# Patient Record
Sex: Male | Born: 1995 | Race: White | Hispanic: No | Marital: Single | Smoking: Current every day smoker
Health system: Southern US, Community
[De-identification: ages and names within clinical notes are randomized; demographics above are authoritative.]

## PROBLEM LIST (undated history)

## (undated) DIAGNOSIS — F32A Depression, unspecified: Secondary | ICD-10-CM

## (undated) DIAGNOSIS — F329 Major depressive disorder, single episode, unspecified: Secondary | ICD-10-CM

## (undated) DIAGNOSIS — F419 Anxiety disorder, unspecified: Secondary | ICD-10-CM

## (undated) HISTORY — PX: NASAL SEPTUM SURGERY: SHX37

---
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
## 2017-03-01 ENCOUNTER — Emergency Department (HOSPITAL_COMMUNITY)
Admission: EM | Admit: 2017-03-01 | Discharge: 2017-03-02 | Disposition: A | Discharge status: Home / Self Care | Payer: Federal, State, Local not specified - PPO | Attending: Emergency Medicine | Admitting: Emergency Medicine

## 2017-03-01 ENCOUNTER — Encounter (HOSPITAL_COMMUNITY): Payer: Self-pay | Admitting: Emergency Medicine

## 2017-03-01 DIAGNOSIS — S0181XA Laceration without foreign body of other part of head, initial encounter: Secondary | ICD-10-CM

## 2017-03-01 DIAGNOSIS — Y999 Unspecified external cause status: Secondary | ICD-10-CM | POA: Insufficient documentation

## 2017-03-01 DIAGNOSIS — Y929 Unspecified place or not applicable: Secondary | ICD-10-CM | POA: Insufficient documentation

## 2017-03-01 DIAGNOSIS — J3489 Other specified disorders of nose and nasal sinuses: Secondary | ICD-10-CM

## 2017-03-01 DIAGNOSIS — F17200 Nicotine dependence, unspecified, uncomplicated: Secondary | ICD-10-CM | POA: Diagnosis not present

## 2017-03-01 DIAGNOSIS — S01411A Laceration without foreign body of right cheek and temporomandibular area, initial encounter: Secondary | ICD-10-CM | POA: Insufficient documentation

## 2017-03-01 DIAGNOSIS — Y939 Activity, unspecified: Secondary | ICD-10-CM | POA: Insufficient documentation

## 2017-03-01 HISTORY — DX: Anxiety disorder, unspecified: F41.9

## 2017-03-01 HISTORY — DX: Depression, unspecified: F32.A

## 2017-03-01 HISTORY — DX: Major depressive disorder, single episode, unspecified: F32.9
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
## 2017-03-02 ENCOUNTER — Emergency Department (HOSPITAL_COMMUNITY): Payer: Federal, State, Local not specified - PPO

## 2017-03-02 MED ORDER — ACETAMINOPHEN 325 MG PO TABS
650.0000 mg | ORAL_TABLET | Freq: Once | ORAL | Status: AC
  Administered 2017-03-02: 650 mg via ORAL
  Filled 2017-03-02: qty 2

## 2017-03-02 NOTE — ED Notes (Signed)
ED Provider at bedside. 
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
## 2018-05-29 ENCOUNTER — Encounter (HOSPITAL_COMMUNITY): Payer: Self-pay | Admitting: Licensed Clinical Social Worker

## 2018-05-29 ENCOUNTER — Ambulatory Visit (INDEPENDENT_AMBULATORY_CARE_PROVIDER_SITE_OTHER): Payer: Federal, State, Local not specified - PPO | Admitting: Licensed Clinical Social Worker

## 2018-05-29 DIAGNOSIS — F333 Major depressive disorder, recurrent, severe with psychotic symptoms: Secondary | ICD-10-CM | POA: Diagnosis not present

## 2018-05-29 DIAGNOSIS — F1210 Cannabis abuse, uncomplicated: Secondary | ICD-10-CM
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
## 2018-06-18 ENCOUNTER — Ambulatory Visit (HOSPITAL_COMMUNITY): Payer: Self-pay | Admitting: Psychiatry

## 2019-06-04 DIAGNOSIS — R6889 Other general symptoms and signs: Secondary | ICD-10-CM | POA: Diagnosis not present

## 2020-09-09 DIAGNOSIS — K602 Anal fissure, unspecified: Secondary | ICD-10-CM | POA: Diagnosis not present

## 2020-09-09 DIAGNOSIS — F329 Major depressive disorder, single episode, unspecified: Secondary | ICD-10-CM | POA: Diagnosis not present

## 2020-11-03 DIAGNOSIS — Z20822 Contact with and (suspected) exposure to covid-19: Secondary | ICD-10-CM | POA: Diagnosis not present

## 2021-02-01 DIAGNOSIS — L03116 Cellulitis of left lower limb: Secondary | ICD-10-CM | POA: Diagnosis not present

## 2022-12-28 DIAGNOSIS — R040 Epistaxis: Secondary | ICD-10-CM | POA: Diagnosis not present

## 2022-12-28 DIAGNOSIS — M25561 Pain in right knee: Secondary | ICD-10-CM | POA: Diagnosis not present

## 2022-12-28 DIAGNOSIS — Z7251 High risk heterosexual behavior: Secondary | ICD-10-CM | POA: Diagnosis not present

## 2023-05-04 DIAGNOSIS — G8929 Other chronic pain: Secondary | ICD-10-CM | POA: Diagnosis not present

## 2023-05-04 DIAGNOSIS — Z23 Encounter for immunization: Secondary | ICD-10-CM | POA: Diagnosis not present

## 2023-05-04 DIAGNOSIS — M79604 Pain in right leg: Secondary | ICD-10-CM | POA: Diagnosis not present

## 2023-05-04 DIAGNOSIS — Z113 Encounter for screening for infections with a predominantly sexual mode of transmission: Secondary | ICD-10-CM | POA: Diagnosis not present

## 2023-05-04 DIAGNOSIS — M25561 Pain in right knee: Secondary | ICD-10-CM | POA: Diagnosis not present

## 2023-05-07 ENCOUNTER — Ambulatory Visit: Admission: RE | Admit: 2023-05-07 | Payer: Federal, State, Local not specified - PPO | Source: Ambulatory Visit

## 2023-05-07 ENCOUNTER — Ambulatory Visit: Payer: Federal, State, Local not specified - PPO

## 2023-05-07 ENCOUNTER — Other Ambulatory Visit: Payer: Self-pay | Admitting: Family Medicine

## 2023-05-07 DIAGNOSIS — M549 Dorsalgia, unspecified: Secondary | ICD-10-CM | POA: Diagnosis not present

## 2023-05-07 DIAGNOSIS — M79604 Pain in right leg: Secondary | ICD-10-CM

## 2023-05-07 DIAGNOSIS — M25561 Pain in right knee: Secondary | ICD-10-CM

## 2023-08-16 DIAGNOSIS — R509 Fever, unspecified: Secondary | ICD-10-CM | POA: Diagnosis not present

## 2023-08-16 DIAGNOSIS — R160 Hepatomegaly, not elsewhere classified: Secondary | ICD-10-CM | POA: Diagnosis not present

## 2023-08-16 DIAGNOSIS — R109 Unspecified abdominal pain: Secondary | ICD-10-CM | POA: Diagnosis not present

## 2023-08-16 DIAGNOSIS — K828 Other specified diseases of gallbladder: Secondary | ICD-10-CM | POA: Diagnosis not present

## 2023-08-16 DIAGNOSIS — R1011 Right upper quadrant pain: Secondary | ICD-10-CM | POA: Diagnosis not present

## 2023-08-16 DIAGNOSIS — K838 Other specified diseases of biliary tract: Secondary | ICD-10-CM | POA: Diagnosis not present

## 2023-08-16 DIAGNOSIS — R7401 Elevation of levels of liver transaminase levels: Secondary | ICD-10-CM | POA: Diagnosis not present

## 2023-08-16 DIAGNOSIS — R197 Diarrhea, unspecified: Secondary | ICD-10-CM | POA: Diagnosis not present

## 2023-08-16 DIAGNOSIS — R519 Headache, unspecified: Secondary | ICD-10-CM | POA: Diagnosis not present

## 2023-08-16 DIAGNOSIS — R112 Nausea with vomiting, unspecified: Secondary | ICD-10-CM | POA: Diagnosis not present

## 2023-11-01 DIAGNOSIS — M5412 Radiculopathy, cervical region: Secondary | ICD-10-CM | POA: Diagnosis not present

## 2023-11-01 DIAGNOSIS — M542 Cervicalgia: Secondary | ICD-10-CM | POA: Diagnosis not present

## 2023-11-01 DIAGNOSIS — Z23 Encounter for immunization: Secondary | ICD-10-CM | POA: Diagnosis not present

## 2023-11-09 DIAGNOSIS — M542 Cervicalgia: Secondary | ICD-10-CM | POA: Diagnosis not present

## 2023-11-09 DIAGNOSIS — M5412 Radiculopathy, cervical region: Secondary | ICD-10-CM | POA: Diagnosis not present

## 2023-11-30 DIAGNOSIS — M542 Cervicalgia: Secondary | ICD-10-CM | POA: Diagnosis not present

## 2023-11-30 DIAGNOSIS — M5412 Radiculopathy, cervical region: Secondary | ICD-10-CM | POA: Diagnosis not present

## 2023-12-10 DIAGNOSIS — M50221 Other cervical disc displacement at C4-C5 level: Secondary | ICD-10-CM | POA: Diagnosis not present

## 2023-12-10 DIAGNOSIS — M50222 Other cervical disc displacement at C5-C6 level: Secondary | ICD-10-CM | POA: Diagnosis not present

## 2023-12-10 DIAGNOSIS — M438X2 Other specified deforming dorsopathies, cervical region: Secondary | ICD-10-CM | POA: Diagnosis not present

## 2023-12-14 DIAGNOSIS — M4722 Other spondylosis with radiculopathy, cervical region: Secondary | ICD-10-CM | POA: Diagnosis not present

## 2023-12-14 DIAGNOSIS — M50122 Cervical disc disorder at C5-C6 level with radiculopathy: Secondary | ICD-10-CM | POA: Diagnosis not present

## 2023-12-31 DIAGNOSIS — M50122 Cervical disc disorder at C5-C6 level with radiculopathy: Secondary | ICD-10-CM | POA: Diagnosis not present

## 2023-12-31 DIAGNOSIS — M4722 Other spondylosis with radiculopathy, cervical region: Secondary | ICD-10-CM | POA: Diagnosis not present

## 2024-01-03 DIAGNOSIS — M79601 Pain in right arm: Secondary | ICD-10-CM | POA: Diagnosis not present

## 2024-01-03 DIAGNOSIS — R200 Anesthesia of skin: Secondary | ICD-10-CM | POA: Diagnosis not present

## 2024-01-03 DIAGNOSIS — M542 Cervicalgia: Secondary | ICD-10-CM | POA: Diagnosis not present

## 2024-01-03 DIAGNOSIS — M4802 Spinal stenosis, cervical region: Secondary | ICD-10-CM | POA: Diagnosis not present

## 2024-01-07 DIAGNOSIS — M50122 Cervical disc disorder at C5-C6 level with radiculopathy: Secondary | ICD-10-CM | POA: Diagnosis not present

## 2024-01-07 DIAGNOSIS — M4722 Other spondylosis with radiculopathy, cervical region: Secondary | ICD-10-CM | POA: Diagnosis not present

## 2024-01-07 DIAGNOSIS — M5412 Radiculopathy, cervical region: Secondary | ICD-10-CM | POA: Diagnosis not present

## 2024-01-07 DIAGNOSIS — M542 Cervicalgia: Secondary | ICD-10-CM | POA: Diagnosis not present

## 2024-01-11 DIAGNOSIS — M4802 Spinal stenosis, cervical region: Secondary | ICD-10-CM | POA: Diagnosis not present

## 2024-01-11 DIAGNOSIS — M5010 Cervical disc disorder with radiculopathy, unspecified cervical region: Secondary | ICD-10-CM | POA: Diagnosis not present

## 2024-01-11 DIAGNOSIS — M542 Cervicalgia: Secondary | ICD-10-CM | POA: Diagnosis not present

## 2024-01-15 DIAGNOSIS — M50122 Cervical disc disorder at C5-C6 level with radiculopathy: Secondary | ICD-10-CM | POA: Diagnosis not present

## 2024-01-15 DIAGNOSIS — M5412 Radiculopathy, cervical region: Secondary | ICD-10-CM | POA: Diagnosis not present

## 2024-01-15 DIAGNOSIS — M4722 Other spondylosis with radiculopathy, cervical region: Secondary | ICD-10-CM | POA: Diagnosis not present

## 2024-01-15 DIAGNOSIS — M542 Cervicalgia: Secondary | ICD-10-CM | POA: Diagnosis not present

## 2024-01-17 DIAGNOSIS — F900 Attention-deficit hyperactivity disorder, predominantly inattentive type: Secondary | ICD-10-CM | POA: Diagnosis not present

## 2024-01-17 DIAGNOSIS — Z01818 Encounter for other preprocedural examination: Secondary | ICD-10-CM | POA: Diagnosis not present

## 2024-01-17 DIAGNOSIS — F419 Anxiety disorder, unspecified: Secondary | ICD-10-CM | POA: Diagnosis not present

## 2024-01-17 DIAGNOSIS — M79601 Pain in right arm: Secondary | ICD-10-CM | POA: Diagnosis not present

## 2024-01-17 DIAGNOSIS — M4802 Spinal stenosis, cervical region: Secondary | ICD-10-CM | POA: Diagnosis not present

## 2024-01-22 DIAGNOSIS — M50222 Other cervical disc displacement at C5-C6 level: Secondary | ICD-10-CM | POA: Diagnosis not present

## 2024-01-22 DIAGNOSIS — M50221 Other cervical disc displacement at C4-C5 level: Secondary | ICD-10-CM | POA: Diagnosis not present

## 2024-01-22 DIAGNOSIS — M4802 Spinal stenosis, cervical region: Secondary | ICD-10-CM | POA: Diagnosis not present

## 2024-01-22 DIAGNOSIS — Z79899 Other long term (current) drug therapy: Secondary | ICD-10-CM | POA: Diagnosis not present

## 2024-01-23 DIAGNOSIS — M4802 Spinal stenosis, cervical region: Secondary | ICD-10-CM | POA: Diagnosis not present

## 2024-01-23 DIAGNOSIS — M50221 Other cervical disc displacement at C4-C5 level: Secondary | ICD-10-CM | POA: Diagnosis not present

## 2024-01-23 DIAGNOSIS — Z79899 Other long term (current) drug therapy: Secondary | ICD-10-CM | POA: Diagnosis not present

## 2024-01-23 DIAGNOSIS — M50222 Other cervical disc displacement at C5-C6 level: Secondary | ICD-10-CM | POA: Diagnosis not present

## 2024-02-04 DIAGNOSIS — Z4789 Encounter for other orthopedic aftercare: Secondary | ICD-10-CM | POA: Diagnosis not present

## 2024-02-04 DIAGNOSIS — M542 Cervicalgia: Secondary | ICD-10-CM | POA: Diagnosis not present

## 2024-02-04 DIAGNOSIS — M4322 Fusion of spine, cervical region: Secondary | ICD-10-CM | POA: Diagnosis not present

## 2024-02-07 DIAGNOSIS — Z4789 Encounter for other orthopedic aftercare: Secondary | ICD-10-CM | POA: Diagnosis not present

## 2024-02-07 DIAGNOSIS — M542 Cervicalgia: Secondary | ICD-10-CM | POA: Diagnosis not present

## 2024-02-13 DIAGNOSIS — M542 Cervicalgia: Secondary | ICD-10-CM | POA: Diagnosis not present

## 2024-02-13 DIAGNOSIS — Z981 Arthrodesis status: Secondary | ICD-10-CM | POA: Diagnosis not present

## 2024-02-13 DIAGNOSIS — Z4789 Encounter for other orthopedic aftercare: Secondary | ICD-10-CM | POA: Diagnosis not present

## 2024-02-13 DIAGNOSIS — M4802 Spinal stenosis, cervical region: Secondary | ICD-10-CM | POA: Diagnosis not present

## 2024-02-14 DIAGNOSIS — M4322 Fusion of spine, cervical region: Secondary | ICD-10-CM | POA: Diagnosis not present

## 2024-02-14 DIAGNOSIS — M542 Cervicalgia: Secondary | ICD-10-CM | POA: Diagnosis not present

## 2024-02-20 DIAGNOSIS — Z4789 Encounter for other orthopedic aftercare: Secondary | ICD-10-CM | POA: Diagnosis not present

## 2024-02-20 DIAGNOSIS — M4322 Fusion of spine, cervical region: Secondary | ICD-10-CM | POA: Diagnosis not present

## 2024-03-06 DIAGNOSIS — T8130XA Disruption of wound, unspecified, initial encounter: Secondary | ICD-10-CM | POA: Diagnosis not present

## 2024-03-10 DIAGNOSIS — Z4789 Encounter for other orthopedic aftercare: Secondary | ICD-10-CM | POA: Diagnosis not present

## 2024-03-10 DIAGNOSIS — T8130XA Disruption of wound, unspecified, initial encounter: Secondary | ICD-10-CM | POA: Diagnosis not present

## 2024-04-15 DIAGNOSIS — M542 Cervicalgia: Secondary | ICD-10-CM | POA: Diagnosis not present

## 2024-04-15 DIAGNOSIS — Z4789 Encounter for other orthopedic aftercare: Secondary | ICD-10-CM | POA: Diagnosis not present

## 2024-04-15 DIAGNOSIS — M4322 Fusion of spine, cervical region: Secondary | ICD-10-CM | POA: Diagnosis not present

## 2024-04-15 DIAGNOSIS — T8130XA Disruption of wound, unspecified, initial encounter: Secondary | ICD-10-CM | POA: Diagnosis not present

## 2024-04-15 DIAGNOSIS — M4802 Spinal stenosis, cervical region: Secondary | ICD-10-CM | POA: Diagnosis not present

## 2024-05-27 DIAGNOSIS — J029 Acute pharyngitis, unspecified: Secondary | ICD-10-CM | POA: Diagnosis not present

## 2024-05-27 DIAGNOSIS — Z6825 Body mass index (BMI) 25.0-25.9, adult: Secondary | ICD-10-CM | POA: Diagnosis not present

## 2024-07-16 DIAGNOSIS — J029 Acute pharyngitis, unspecified: Secondary | ICD-10-CM | POA: Diagnosis not present

## 2024-07-16 DIAGNOSIS — R051 Acute cough: Secondary | ICD-10-CM | POA: Diagnosis not present

## 2024-07-18 DIAGNOSIS — M542 Cervicalgia: Secondary | ICD-10-CM | POA: Diagnosis not present

## 2024-07-18 DIAGNOSIS — M79601 Pain in right arm: Secondary | ICD-10-CM | POA: Diagnosis not present

## 2024-07-18 DIAGNOSIS — Z4789 Encounter for other orthopedic aftercare: Secondary | ICD-10-CM | POA: Diagnosis not present

## 2024-07-18 DIAGNOSIS — M4322 Fusion of spine, cervical region: Secondary | ICD-10-CM | POA: Diagnosis not present

## 2024-08-12 DIAGNOSIS — M4322 Fusion of spine, cervical region: Secondary | ICD-10-CM | POA: Diagnosis not present

## 2024-08-12 DIAGNOSIS — M79601 Pain in right arm: Secondary | ICD-10-CM | POA: Diagnosis not present

## 2024-08-12 DIAGNOSIS — R200 Anesthesia of skin: Secondary | ICD-10-CM | POA: Diagnosis not present

## 2024-08-12 DIAGNOSIS — M542 Cervicalgia: Secondary | ICD-10-CM | POA: Diagnosis not present

## 2024-08-25 DIAGNOSIS — Z981 Arthrodesis status: Secondary | ICD-10-CM | POA: Diagnosis not present

## 2024-08-25 DIAGNOSIS — R202 Paresthesia of skin: Secondary | ICD-10-CM | POA: Diagnosis not present

## 2024-08-29 DIAGNOSIS — M79601 Pain in right arm: Secondary | ICD-10-CM | POA: Diagnosis not present

## 2024-08-29 DIAGNOSIS — M79641 Pain in right hand: Secondary | ICD-10-CM | POA: Diagnosis not present

## 2024-08-29 DIAGNOSIS — R200 Anesthesia of skin: Secondary | ICD-10-CM | POA: Diagnosis not present

## 2024-08-29 DIAGNOSIS — M4322 Fusion of spine, cervical region: Secondary | ICD-10-CM | POA: Diagnosis not present

## 2024-09-24 DIAGNOSIS — M5412 Radiculopathy, cervical region: Secondary | ICD-10-CM | POA: Diagnosis not present
